# Patient Record
Sex: Male | Born: 2000 | Race: White | Hispanic: No | Marital: Single | Smoking: Never smoker
Health system: Southern US, Community
[De-identification: ages and names within clinical notes are randomized; demographics above are authoritative.]

## PROBLEM LIST (undated history)

## (undated) DIAGNOSIS — L309 Dermatitis, unspecified: Secondary | ICD-10-CM

## (undated) DIAGNOSIS — L709 Acne, unspecified: Secondary | ICD-10-CM

## (undated) DIAGNOSIS — T7840XA Allergy, unspecified, initial encounter: Secondary | ICD-10-CM

## (undated) HISTORY — PX: ADENOIDECTOMY: SUR15

## (undated) HISTORY — DX: Acne, unspecified: L70.9

## (undated) HISTORY — DX: Allergy, unspecified, initial encounter: T78.40XA

## (undated) HISTORY — PX: OTHER SURGICAL HISTORY: SHX169

## (undated) HISTORY — PX: TONSILLECTOMY: SUR1361

## (undated) HISTORY — DX: Dermatitis, unspecified: L30.9

---
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
## 2021-01-03 ENCOUNTER — Ambulatory Visit: Payer: No Typology Code available for payment source | Admitting: Dermatology

## 2021-01-07 ENCOUNTER — Ambulatory Visit (INDEPENDENT_AMBULATORY_CARE_PROVIDER_SITE_OTHER): Payer: No Typology Code available for payment source | Admitting: Dermatology

## 2021-01-07 ENCOUNTER — Other Ambulatory Visit: Payer: Self-pay

## 2021-01-07 ENCOUNTER — Encounter: Payer: Self-pay | Admitting: Dermatology

## 2021-01-07 DIAGNOSIS — L700 Acne vulgaris: Secondary | ICD-10-CM

## 2021-01-07 DIAGNOSIS — Z5181 Encounter for therapeutic drug level monitoring: Secondary | ICD-10-CM | POA: Diagnosis not present

## 2021-01-07 MED ORDER — ISOTRETINOIN 40 MG PO CAPS: 40.0000 mg | ORAL_CAPSULE | Freq: Every day | ORAL | 0 refills | Status: DC

## 2021-01-07 NOTE — Progress Notes (Signed)
[email or web address]

## 2021-01-15 ENCOUNTER — Ambulatory Visit: Payer: No Typology Code available for payment source | Admitting: Dermatology

## 2021-01-19 ENCOUNTER — Encounter: Payer: Self-pay | Admitting: Dermatology

## 2021-01-19 NOTE — Progress Notes (Signed)
   Follow-Up Visit   Subjective  Troy Harper is a 20 y.o. male who presents for the following: Acne (31 day isotretinoin f/u).  Acne Location: Mostly face Duration:  Quality: Clear Associated Signs/Symptoms: Modifying Factors: Isotretinoin Severity:  Timing: Context:   Objective  Well appearing patient in no apparent distress; mood and affect are within normal limits. Objective  Head - Anterior (Face): Acne essentially clear.  Moderate cheilitis, normal mood.  No epistaxis.    A focused examination was performed including Head and neck. Relevant physical exam findings are noted in the Assessment and Plan.   Assessment & Plan    Acne vulgaris Head - Anterior (Face)  Continue isotretinoin- follow up in 31 days.  If he remains completely clear, he will be just short of the [place] target dose in 1 month and he may choose to stop therapy at that point.  ISOtretinoin (ACCUTANE) 40 MG capsule - Head - Anterior (Face)  Encounter for therapeutic drug monitoring  Reordered Medications ISOtretinoin (ACCUTANE) 40 MG capsule      I, Janalyn Harder, MD, have reviewed all documentation for this visit.  The documentation on 01/19/21 for the exam, diagnosis, procedures, and orders are all accurate and complete.

## 2021-02-06 ENCOUNTER — Other Ambulatory Visit: Payer: Self-pay

## 2021-02-06 ENCOUNTER — Encounter: Payer: Self-pay | Admitting: Dermatology

## 2021-02-06 ENCOUNTER — Ambulatory Visit (INDEPENDENT_AMBULATORY_CARE_PROVIDER_SITE_OTHER): Payer: No Typology Code available for payment source | Admitting: Dermatology

## 2021-02-06 DIAGNOSIS — L700 Acne vulgaris: Secondary | ICD-10-CM | POA: Diagnosis not present

## 2021-02-06 DIAGNOSIS — Z5181 Encounter for therapeutic drug level monitoring: Secondary | ICD-10-CM | POA: Diagnosis not present

## 2021-02-06 MED ORDER — ISOTRETINOIN 40 MG PO CAPS: 40.0000 mg | ORAL_CAPSULE | Freq: Every day | ORAL | 0 refills | Status: DC

## 2021-02-07 ENCOUNTER — Telehealth: Payer: Self-pay | Admitting: *Deleted

## 2021-02-07 ENCOUNTER — Encounter: Payer: Self-pay | Admitting: Dermatology

## 2021-02-07 ENCOUNTER — Ambulatory Visit: Payer: No Typology Code available for payment source | Admitting: Dermatology

## 2021-02-07 NOTE — Telephone Encounter (Signed)
Received message in mychart regarding patient next appointment and if it was necessary. Called patient unable to leave a voicemail because mailbox was full.

## 2021-02-26 ENCOUNTER — Encounter: Payer: Self-pay | Admitting: Dermatology

## 2021-02-26 NOTE — Progress Notes (Signed)
   Follow-Up Visit   Subjective  Troy Harper is a 20 y.o. male who presents for the following: Follow-up (31 DAYS PATIENT STATED HE HAD A FEW NOSE BLEEDS).  Acne Location:  Duration:  Quality: Clear Associated Signs/Symptoms: Modifying Factors: Isotretinoin Severity:  Timing: Context:   Objective  Well appearing patient in no apparent distress; mood and affect are within normal limits. Objective  Head - Anterior (Face): Acne essentially completely clear    A focused examination was performed including Head and neck.. Relevant physical exam findings are noted in the Assessment and Plan.   Assessment & Plan    Acne vulgaris Head - Anterior (Face)  Last month isotretinoin, follow up by phone unless he has a significant flare in which case I will be pleased to see Maikel again.  ISOtretinoin (ACCUTANE) 40 MG capsule - Head - Anterior (Face)  Encounter for therapeutic drug monitoring  Reordered Medications ISOtretinoin (ACCUTANE) 40 MG capsule      I, Janalyn Harder, MD, have reviewed all documentation for this visit.  The documentation on 02/26/21 for the exam, diagnosis, procedures, and orders are all accurate and complete.

## 2021-03-07 ENCOUNTER — Ambulatory Visit: Payer: No Typology Code available for payment source | Admitting: Dermatology

## 2021-03-12 ENCOUNTER — Ambulatory Visit: Payer: No Typology Code available for payment source | Admitting: Dermatology

## 2021-03-25 NOTE — Telephone Encounter (Signed)
error 

## 2021-03-26 ENCOUNTER — Other Ambulatory Visit: Payer: Self-pay

## 2021-03-26 ENCOUNTER — Ambulatory Visit: Payer: No Typology Code available for payment source | Admitting: Dermatology

## 2021-03-26 ENCOUNTER — Encounter: Payer: Self-pay | Admitting: Dermatology

## 2021-03-26 ENCOUNTER — Ambulatory Visit (INDEPENDENT_AMBULATORY_CARE_PROVIDER_SITE_OTHER): Payer: No Typology Code available for payment source | Admitting: Dermatology

## 2021-03-26 ENCOUNTER — Ambulatory Visit: Payer: No Typology Code available for payment source | Admitting: Physician Assistant

## 2021-03-26 DIAGNOSIS — L700 Acne vulgaris: Secondary | ICD-10-CM | POA: Diagnosis not present

## 2021-04-05 ENCOUNTER — Encounter: Payer: Self-pay | Admitting: Dermatology

## 2021-04-05 NOTE — Progress Notes (Signed)
   Follow-Up Visit   Subjective  Troy Harper is a 20 y.o. male who presents for the following: Acne (31 days).  Acne Location:  Duration:  Quality: Clear Associated Signs/Symptoms: Modifying Factors: Isotretinoin Severity:  Timing: Context:   Objective  Well appearing patient in no apparent distress; mood and affect are within normal limits. Objective  Head - Anterior (Face): Inflammatory acne is completely clear and he will reach the 150 mg/kg target dose which is used by most of the oral with this prescription.  Avoid sunburn.    A focused examination was performed including Head and neck.. Relevant physical exam findings are noted in the Assessment and Plan.   Assessment & Plan    Acne vulgaris Head - Anterior (Face)  A final prescription of isotretinoin.  If there is a minor flare he will call me and we will add a topical retinoid.  If there is a severe flare I would be delighted to see Mr. Peatross back.  Other Related Medications ISOtretinoin (ACCUTANE) 40 MG capsule      I, Janalyn Harder, MD, have reviewed all documentation for this visit.  The documentation on 04/05/21 for the exam, diagnosis, procedures, and orders are all accurate and complete.

## 2021-07-01 ENCOUNTER — Encounter: Payer: Self-pay | Admitting: Dermatology

## 2021-07-01 NOTE — Progress Notes (Signed)
   Follow-Up Visit   Subjective  Troy Harper is a 20 y.o. male who presents for the following: Acne (isotret follow up).  Acne Location:  Duration:  Quality: Clear Associated Signs/Symptoms: Modifying Factors: Isotretinoin Severity:  Timing: Context:   Objective  Well appearing patient in no apparent distress; mood and affect are within normal limits. Head Inflammatory acne virtually clear.  Moderate cheilitis.  Normal mood.    A focused examination was performed including head and neck. Relevant physical exam findings are noted in the Assessment and Plan.   Assessment & Plan    Encounter for therapeutic drug monitoring  Related Medications ISOtretinoin (ACCUTANE) 40 MG capsule Take 1 capsule (40 mg total) by mouth daily.  Acne vulgaris Head  Maintain current isotretinoin dosing; will reach [place] target dose in 3 months.  Related Medications clindamycin (CLINDAGEL) 1 % gel Apply topically in the morning.  ISOtretinoin (ACCUTANE) 40 MG capsule Take 1 capsule (40 mg total) by mouth daily.      I, Janalyn Harder, MD, have reviewed all documentation for this visit.  The documentation on 07/01/21 for the exam, diagnosis, procedures, and orders are all accurate and complete.

## 2022-05-02 ENCOUNTER — Ambulatory Visit: Payer: No Typology Code available for payment source | Admitting: Nurse Practitioner

## 2022-05-06 ENCOUNTER — Ambulatory Visit: Payer: No Typology Code available for payment source | Admitting: Nurse Practitioner

## 2022-05-14 ENCOUNTER — Encounter: Payer: Self-pay | Admitting: Nurse Practitioner

## 2022-05-14 ENCOUNTER — Ambulatory Visit (INDEPENDENT_AMBULATORY_CARE_PROVIDER_SITE_OTHER): Payer: No Typology Code available for payment source | Admitting: Nurse Practitioner

## 2022-05-14 ENCOUNTER — Other Ambulatory Visit: Payer: Self-pay

## 2022-05-14 VITALS — BP 133/81 | HR 63 | Temp 98.3°F | Resp 20 | Ht 62.0 in | Wt 139.0 lb

## 2022-05-14 DIAGNOSIS — B070 Plantar wart: Secondary | ICD-10-CM | POA: Diagnosis not present

## 2022-05-14 MED ORDER — CLINDAMYCIN PHOSPHATE 1 % EX GEL: Freq: Two times a day (BID) | CUTANEOUS | 2 refills | Status: DC

## 2022-05-14 NOTE — Patient Instructions (Signed)
Cryoablation Cryoablation is a procedure used to remove abnormal growths or cancerous tissue. This is done by freezing the growth or tissue with either liquid nitrogen or argon gas. This procedure is also known as cryotherapy or cryosurgery. This procedure may be done to treat many conditions, including: Skin tumors. Non-cancerous (benign) knots of tissue called nodules. A type of eye cancer (retinoblastoma). Cancers of the prostate, liver, kidney, cervix, lung, and bone. Tell a health care provider about: Any allergies you have. All medicines you are taking, including vitamins, herbs, eye drops, creams, and over-the-counter medicines. Any problems you or family members have had with anesthetic medicines. Any blood disorders you have. Any surgeries you have had. Any medical conditions you have. Whether you are pregnant or may be pregnant. What are the risks? Generally, this is a safe procedure. However, problems may occur, including: Infection. Bleeding. Swelling. Allergic reactions to medicines. Damage to nearby structures or organs, such as damage to nerves, which may cause numbness. This is rare. What happens before the procedure? Staying hydrated Follow instructions from your health care provider about hydration, which may include: Up to 2 hours before the procedure - you may continue to drink clear liquids, such as water, clear fruit juice, black coffee, and plain tea.  Eating and drinking restrictions Follow instructions from your health care provider about eating and drinking, which may include: 8 hours before the procedure - stop eating heavy meals or foods, such as meat, fried foods, or fatty foods. 6 hours before the procedure - stop eating light meals or foods, such as toast or cereal. 6 hours before the procedure - stop drinking milk or drinks that contain milk. 2 hours before the procedure - stop drinking clear liquids. Medicines Ask your health care provider  about: Changing or stopping your regular medicines. This is especially important if you are taking diabetes medicines or blood thinners. Taking medicines such as aspirin and ibuprofen. These medicines can thin your blood. Do not take these medicines unless your health care provider tells you to take them. Taking over-the-counter medicines, vitamins, herbs, and supplements. Tests You may have tests done, including blood tests and imaging tests. General instructions A medical history will be taken, and a physical exam will be done. Do not use any products that contain nicotine or tobacco for at least 4 weeks before the procedure. These products include cigarettes, e-cigarettes, and chewing tobacco. If you need help quitting, ask your health care provider. Ask your health care provider: How your surgery site will be marked. What steps will be taken to help prevent infection. These may include: Removing hair at the surgery site. Washing skin with a germ-killing soap. Receiving antibiotic medicine. Plan to have someone take you home from the hospital or clinic. If you will be going home right after the procedure, plan to have someone with you for 24 hours. What happens during the procedure? An IV will be inserted into one of your veins. You will be given one or more of the following: A medicine to help you relax (sedative). A medicine to numb the area (local anesthetic). A medicine to make you fall asleep (general anesthetic). A medicine that is injected into your spine to numb the area below and slightly above the injection site (spinal anesthetic). A medicine that is injected into an area of your body to numb everything below the injection site (regional anesthetic). A device called a cryoprobe will be used to treat the growth by freezing it. The cryoprobe has liquid   nitrogen or argon gas flowing through it. Depending on how deep in the body the growth is found, the cryoprobe can be: Applied  directly to the area, such as for skin cancers or nodules. Inserted through an incision into the area, such as the prostate. Passed through a thin, long tube (endoscope) to reach deeper areas of the body, such as the lungs or liver. The cryoprobe may be guided using imaging, such as an ultrasound, a CT scan, or an MRI. Liquid nitrogen or argon gas will be delivered through the cryoprobe to the growth until it is frozen and destroyed. The process may be repeated on other areas depending on how many areas need treatment. The cryoprobe will be removed, and pressure will be applied to stop any bleeding. If an incision was made, it will be closed with stitches (sutures) and covered with a bandage (dressing). The procedure will vary depending on the location of the growth. The procedure may also vary among health care providers and hospitals. What happens after the procedure?  Your blood pressure, heart rate, breathing rate, and blood oxygen level will be monitored until you leave the hospital or clinic. You will be given medicine to help with pain, nausea, and vomiting as needed. If you were given a sedative during the procedure, it can affect you for several hours. Do not drive or operate machinery until your health care provider says that it is safe. Summary Cryoablation is a procedure used to remove abnormal growths or cancerous tissue. This is done by freezing the growth or tissue with either liquid nitrogen or argon gas. If you will be going home right after the procedure, plan to have someone with you for 24 hours. Your health care provider may use a device called a cryoprobe that has liquid nitrogen or argon gas flowing through it. Liquid nitrogen or argon gas will be delivered through the cryoprobe to the growth until it is frozen and destroyed. The process may be repeated on other areas depending on how many areas need treatment. This information is not intended to replace advice given to you  by your health care provider. Make sure you discuss any questions you have with your health care provider. Document Revised: 08/25/2019 Document Reviewed: 08/25/2019 Elsevier Patient Education  2023 [employer].

## 2022-05-14 NOTE — Progress Notes (Signed)
   Subjective:    Patient ID: Troy Harper, male    DOB: Dec 05, 2000, 21 y.o.   MRN: [id number]   Chief Complaint: plantar wart  HPI Wart in bottom of left foot. Painful to touch. Has been using OTC treatments and has helped some.  Needs clindamycin gel for his acne refilled  Review of Systems  Constitutional:  Negative for diaphoresis.  Eyes:  Negative for pain.  Respiratory:  Negative for shortness of breath.   Cardiovascular:  Negative for chest pain, palpitations and leg swelling.  Gastrointestinal:  Negative for abdominal pain.  Endocrine: Negative for polydipsia.  Skin:  Negative for rash.  Neurological:  Negative for dizziness, weakness and headaches.  Hematological:  Does not bruise/bleed easily.  All other systems reviewed and are negative.      Objective:   Physical Exam Vitals reviewed.  Constitutional:      Appearance: Normal appearance.  Cardiovascular:     Rate and Rhythm: Normal rate and regular rhythm.     Heart sounds: Normal heart sounds.  Pulmonary:     Effort: Pulmonary effort is normal.     Breath sounds: Normal breath sounds.  Skin:    General: Skin is warm.  Neurological:     General: No focal deficit present.     Mental Status: He is alert and oriented to person, place, and time.  Psychiatric:        Mood and Affect: Mood normal.        Behavior: Behavior normal.    BP 133/81   Pulse 63   Temp 98.3 F (36.8 C) (Temporal)   Resp 20   Ht 5\' 2"  (1.575 m)   Wt 139 lb (63 kg)   SpO2 100%   BMI 25.42 kg/m    Cryotherapy of wart on left foot     Assessment & Plan:  in today with chief complaint of Area to bottom of left foot   1. Plantar wart Keep clean and dry Do not pick or scratch Soak in epsom salt bid Watch for signs of infection RTO prn    The above assessment and management plan was discussed with the patient. The patient verbalized understanding of and has agreed to the management plan. Patient is aware  to call the clinic if symptoms persist or worsen. Patient is aware when to return to the clinic for a follow-up visit. Patient educated on when it is appropriate to go to the emergency department.   Troy Troy Crape, FNP

## 2022-06-24 ENCOUNTER — Ambulatory Visit: Payer: No Typology Code available for payment source | Admitting: Dermatology

## 2022-09-09 ENCOUNTER — Ambulatory Visit
Payer: No Typology Code available for payment source | Attending: Cardiovascular Disease | Admitting: Interventional Cardiology

## 2022-09-09 VITALS — BP 128/54 | HR 63 | Ht 64.5 in | Wt 141.0 lb

## 2022-09-09 DIAGNOSIS — R002 Palpitations: Secondary | ICD-10-CM | POA: Diagnosis not present

## 2022-09-09 NOTE — Progress Notes (Signed)
Cardiology Office Note   Date:  09/09/2022   ID:  Troy Harper, DOB [birth year], MRN [id number]  PCP:  Bennie Pierini, FNP    No chief complaint on file.  palpitations  Wt Readings from Last 3 Encounters:  09/09/22 141 lb (64 kg)  05/14/22 139 lb (63 kg)  08/14/20 138 lb (62.6 kg) (30 %, Z= -0.52)*   * Growth percentiles are based on CDC (Boys, 2-20 Years) data.       History of Present Illness: Troy Harper is a 21 y.o. male who is being seen today for the evaluation of palpitations at the request of Rebekah Chesterfield, NP.  Primary care records show that he reports feeling like his heart is beating harder than normal.  Episodes occur 2-3 times a day.  There is occasional shortness of breath.  Typically episodes occur while he is sitting down.  Sx were more pronounced in the summer.  No sx in the past month.   He is a Consulting civil engineer at [employer].  He runs on the treadmill and does weights.   Of late, he Denies : Chest pain. Dizziness. Leg edema. Nitroglycerin use. Orthopnea. Palpitations. Paroxysmal nocturnal dyspnea. Shortness of breath. Syncope.    Past Medical History:  Diagnosis Date   Acne    Allergy     Past Surgical History:  Procedure Laterality Date   ADENOIDECTOMY     TONSILLECTOMY     TUBES IN EARS       Current Outpatient Medications  Medication Sig Dispense Refill   cetirizine (ZYRTEC) 10 MG tablet Take 10 mg by mouth daily.     clindamycin (CLINDAGEL) 1 % gel Apply topically 2 (two) times daily. 30 g 2   triamcinolone (NASACORT) 55 MCG/ACT AERO nasal inhaler      triamcinolone cream (KENALOG) 0.1 % Apply 1 application topically 2 (two) times daily. 135 g 3   SODIUM FLUORIDE 5000 PPM 1.1 % PSTE Take by mouth as directed.     No current facility-administered medications for this visit.    Allergies:   Menactra [mening acy&w-135 diphth conj]    Social History:  The patient  reports that he has never smoked. He has never used smokeless tobacco.  He reports that he does not drink alcohol and does not use drugs.   Family History:  The patient's family history includes Cancer in his paternal grandfather; Hypertension in his maternal grandfather, maternal grandmother, mother, paternal grandfather, and paternal grandmother; Multiple sclerosis in his father.    ROS:  Please see the history of present illness.   Otherwise, review of systems are positive for palpitations-improved.   All other systems are reviewed and negative.    PHYSICAL EXAM: VS:  BP (!) 128/54   Pulse 63   Ht 5' 4.5" (1.638 m)   Wt 141 lb (64 kg)   BMI 23.83 kg/m  , BMI Body mass index is 23.83 kg/m. GEN: Well nourished, well developed, in no acute distress HEENT: normal Neck: no JVD, carotid bruits, or masses Cardiac: RRR; no murmurs, rubs, or gallops,no edema  Respiratory:  clear to auscultation bilaterally, normal work of breathing GI: soft, nontender, nondistended, + BS MS: no deformity or atrophy Skin: warm and dry, no rash Neuro:  Strength and sensation are intact Psych: euthymic mood, full affect   EKG:   The ekg ordered today demonstrates NSR, nonspecific ST changes   Recent Labs: No results found for requested labs within last 365 days.   Lipid Panel  Component Value Date/Time   CHOL 174 (H) 08/14/2020 1449   TRIG 86 08/14/2020 1449   HDL 47 08/14/2020 1449   CHOLHDL 3.7 08/14/2020 1449   LDLCALC 109 08/14/2020 1449     Other studies Reviewed: Additional studies/ records that were reviewed today with results demonstrating: Event monitor reviewed.   ASSESSMENT AND PLAN:  Palpitations: Resolved.  No high risk features when he had them.  He did have a PAC noted on his monitor.  Symptom pattern is consistent with symptomatic PACs and that he felt the palpitations more at rest.  Avoid excessive caffeine.  He only drinks milk and water.  We talked about beta-blockade but I would not recommend that he take any medication at his young age for  very mild symptoms. We discussed further testing.  Since his symptoms have resolved and he has a normal exam as well as essentially normal ECG, no further testing ordered.  If he had any change in his exercise tolerance or fluid retention, will plan for echocardiogram. Continue regular exercise.  No restrictions.  He will let us know if there is any change in his stamina.  Patient and his mom are present and agree with the plan.   Current medicines are reviewed at length with the patient today.  The patient concerns regarding his medicines were addressed.  The following changes have been made:  No change  Labs/ tests ordered today include:  No orders of the defined types were placed in this encounter.   Recommend 150 minutes/week of aerobic exercise Low fat, low carb, high fiber diet recommended  Disposition:   FU as needed   Signed, Larae Grooms, MD  09/09/2022 3:32 PM    [facility] [place], [place], [place]  [address] Phone: [phone]; Fax: [phone]

## 2022-09-09 NOTE — Patient Instructions (Signed)
Medication Instructions:  Your physician recommends that you continue on your current medications as directed. Please refer to the Current Medication list given to you today.  *If you need a refill on your cardiac medications before your next appointment, please call your pharmacy*   Lab Work: none If you have labs (blood work) drawn today and your tests are completely normal, you will receive your results only by: MyChart Message (if you have MyChart) OR A paper copy in the mail If you have any lab test that is abnormal or we need to change your treatment, we will call you to review the results.   Testing/Procedures: none   Follow-Up: At [facility], you and your health needs are our priority.  As part of our continuing mission to provide you with exceptional heart care, we have created designated Provider Care Teams.  These Care Teams include your primary Cardiologist (physician) and Advanced Practice Providers (APPs -  Physician Assistants and Nurse Practitioners) who all work together to provide you with the care you need, when you need it.  We recommend signing up for the patient portal called "MyChart".  Sign up information is provided on this After Visit Summary.  MyChart is used to connect with patients for Virtual Visits (Telemedicine).  Patients are able to view lab/test results, encounter notes, upcoming appointments, etc.  Non-urgent messages can be sent to your provider as well.   To learn more about what you can do with MyChart, go to [email or web address].com.    Your next appointment:   As needed  The format for your next appointment:   In Person  Provider:   Jayadeep Varanasi, MD     Other Instructions   Important Information About Sugar       

## 2022-09-12 ENCOUNTER — Ambulatory Visit: Payer: No Typology Code available for payment source | Admitting: Interventional Cardiology

## 2022-09-12 ENCOUNTER — Ambulatory Visit: Payer: No Typology Code available for payment source | Admitting: Cardiovascular Disease

## 2022-10-20 ENCOUNTER — Other Ambulatory Visit: Payer: Self-pay | Admitting: Nurse Practitioner

## 2022-11-05 ENCOUNTER — Encounter: Payer: Self-pay | Admitting: Interventional Cardiology

## 2023-03-02 ENCOUNTER — Other Ambulatory Visit: Payer: Self-pay | Admitting: Nurse Practitioner

## 2023-03-02 NOTE — Telephone Encounter (Signed)
Last office visit 05/14/22 Last refill 10/20/22, 30 grams, 1 refill

## 2023-09-13 ENCOUNTER — Other Ambulatory Visit: Payer: Self-pay

## 2023-09-13 ENCOUNTER — Emergency Department (HOSPITAL_BASED_OUTPATIENT_CLINIC_OR_DEPARTMENT_OTHER)
Admission: EM | Admit: 2023-09-13 | Discharge: 2023-09-13 | Disposition: A | Discharge status: Home / Self Care | Payer: No Typology Code available for payment source | Attending: Emergency Medicine | Admitting: Emergency Medicine

## 2023-09-13 ENCOUNTER — Encounter (HOSPITAL_BASED_OUTPATIENT_CLINIC_OR_DEPARTMENT_OTHER): Payer: Self-pay | Admitting: Emergency Medicine

## 2023-09-13 ENCOUNTER — Ambulatory Visit (HOSPITAL_COMMUNITY): Payer: No Typology Code available for payment source

## 2023-09-13 DIAGNOSIS — R1110 Vomiting, unspecified: Secondary | ICD-10-CM | POA: Diagnosis present

## 2023-09-13 DIAGNOSIS — Z1152 Encounter for screening for COVID-19: Secondary | ICD-10-CM | POA: Diagnosis not present

## 2023-09-13 LAB — COMPREHENSIVE METABOLIC PANEL
ALT: 37 U/L (ref 0–44)
AST: 22 U/L (ref 15–41)
Albumin: 4.4 g/dL (ref 3.5–5.0)
Alkaline Phosphatase: 49 U/L (ref 38–126)
Anion gap: 6 (ref 5–15)
BUN: 12 mg/dL (ref 6–20)
CO2: 27 mmol/L (ref 22–32)
Calcium: 9 mg/dL (ref 8.9–10.3)
Chloride: 105 mmol/L (ref 98–111)
Creatinine, Ser: 0.84 mg/dL (ref 0.61–1.24)
GFR, Estimated: >60 mL/min (ref >60)
Glucose, Bld: 88 mg/dL (ref 70–99)
Potassium: 3.3 mmol/L — ABNORMAL LOW (ref 3.5–5.1)
Sodium: 138 mmol/L (ref 135–145)
Total Bilirubin: 0.8 mg/dL (ref 0.3–1.2)
Total Protein: 7.1 g/dL (ref 6.5–8.1)

## 2023-09-13 LAB — CBC WITH DIFFERENTIAL/PLATELET
Abs Immature Granulocytes: 0.01 10*3/uL (ref 0.00–0.07)
Basophils Absolute: 0 10*3/uL (ref 0.0–0.1)
Basophils Relative: 0 %
Eosinophils Absolute: 0.1 10*3/uL (ref 0.0–0.5)
Eosinophils Relative: 2 %
HCT: 39.1 % (ref 39.0–52.0)
Hemoglobin: 13.8 g/dL (ref 13.0–17.0)
Immature Granulocytes: 0 %
Lymphocytes Relative: 35 %
Lymphs Abs: 1.9 10*3/uL (ref 0.7–4.0)
MCH: 30.5 pg (ref 26.0–34.0)
MCHC: 35.3 g/dL (ref 30.0–36.0)
MCV: 86.5 fL (ref 80.0–100.0)
Monocytes Absolute: 0.3 10*3/uL (ref 0.1–1.0)
Monocytes Relative: 6 %
Neutro Abs: 3 10*3/uL (ref 1.7–7.7)
Neutrophils Relative %: 57 %
Platelets: 209 10*3/uL (ref 150–400)
RBC: 4.52 MIL/uL (ref 4.22–5.81)
RDW: 12.2 % (ref 11.5–15.5)
WBC: 5.4 10*3/uL (ref 4.0–10.5)
nRBC: 0 % (ref 0.0–0.2)

## 2023-09-13 LAB — URINALYSIS, ROUTINE W REFLEX MICROSCOPIC
Bilirubin Urine: NEGATIVE
Glucose, UA: NEGATIVE mg/dL
Hgb urine dipstick: NEGATIVE
Ketones, ur: NEGATIVE mg/dL
Leukocytes,Ua: NEGATIVE
Nitrite: NEGATIVE
Protein, ur: NEGATIVE mg/dL
Specific Gravity, Urine: 1.01 (ref 1.005–1.030)
pH: 6 (ref 5.0–8.0)

## 2023-09-13 LAB — RESP PANEL BY RT-PCR (RSV, FLU A&B, COVID)  RVPGX2
Influenza A by PCR: NEGATIVE
Influenza B by PCR: NEGATIVE
Resp Syncytial Virus by PCR: NEGATIVE
SARS Coronavirus 2 by RT PCR: NEGATIVE

## 2023-09-13 LAB — MONONUCLEOSIS SCREEN: Mono Screen: NEGATIVE

## 2023-09-13 LAB — LIPASE, BLOOD: Lipase: 31 U/L (ref 11–51)

## 2023-09-13 MED ORDER — ONDANSETRON HCL 4 MG PO TABS: 4.0000 mg | ORAL_TABLET | Freq: Three times a day (TID) | ORAL | 0 refills | Status: DC | PRN

## 2023-09-13 NOTE — ED Provider Notes (Signed)
[facility] Provider Note  CSN: [id number] Arrival date & time: 09/13/23 1442  Chief Complaint(s) Emesis  HPI Troy Harper is a 22 y.o. male with no significant past medical history presenting to the emergency department with vomiting.  Patient reports vomiting for around 2 weeks.  It occurs almost always after eating.  He reports that he eats, feels full and then feels like vomiting and vomits.  No abdominal pain.  No fevers or chills.  No headaches or head injury.  No chest pain or shortness of breath.  No leg swelling.  Recently also has been dealing with a lingering cough for 4 to 5 weeks and was on multiple courses of antibiotics, prednisone, Mucinex.  Currently continue to take Mucinex and Tessalon Perles although his cough is significantly improved.   Past Medical History Past Medical History:  Diagnosis Date   Acne    Allergy    Patient Active Problem List   Diagnosis Date Noted   Pain of left hip joint 09/11/2017   Bursitis of left hip 09/11/2017   Family history of Crohn's disease 04/08/2017   Home Medication(s) Prior to Admission medications   Medication Sig Start Date End Date Taking? Authorizing Provider  ondansetron (ZOFRAN) 4 MG tablet Take 1 tablet (4 mg total) by mouth every 8 (eight) hours as needed for nausea or vomiting. 09/13/23  Yes Lonell Grandchild, MD  cetirizine (ZYRTEC) 10 MG tablet Take 10 mg by mouth daily.    [provider]  clindamycin (CLINDAGEL) 1 % gel Apply topically 2 (two) times daily. 03/02/23   Daphine Deutscher, Mary-Margaret, FNP  SODIUM FLUORIDE 5000 PPM 1.1 % PSTE Take by mouth as directed. 07/28/22   [provider]  triamcinolone (NASACORT) 55 MCG/ACT AERO nasal inhaler     [provider]  triamcinolone cream (KENALOG) 0.1 % Apply 1 application topically 2 (two) times daily. 07/06/19   Remus Loffler, PA-C                                                                                                                                     Past Surgical History Past Surgical History:  Procedure Laterality Date   ADENOIDECTOMY     TONSILLECTOMY     TUBES IN EARS     Family History Family History  Problem Relation Age of Onset   Hypertension Mother    Multiple sclerosis Father    Hypertension Maternal Grandmother    Hypertension Maternal Grandfather    Hypertension Paternal Grandmother    Hypertension Paternal Grandfather    Cancer Paternal Grandfather     Social History Social History   Tobacco Use   Smoking status: Never   Smokeless tobacco: Never  Vaping Use   Vaping status: Never Used  Substance Use Topics   Alcohol use: No   Drug use: No   Allergies Menactra [mening acy&w-135 diphth conj]  Review of Systems Review of Systems  All other systems reviewed and are negative.   Physical Exam Vital Signs  I have reviewed the triage vital signs BP (!) 131/93 (BP Location: Right Arm)   Pulse 70   Temp (!) 97.5 F (36.4 C) (Oral)   Resp 16   Ht 5\' 5"  (1.651 m)   Wt 59 kg   SpO2 100%   BMI 21.63 kg/m  Physical Exam Vitals and nursing note reviewed.  Constitutional:      General: He is not in acute distress.    Appearance: Normal appearance.  HENT:     Mouth/Throat:     Mouth: Mucous membranes are moist.  Eyes:     Conjunctiva/sclera: Conjunctivae normal.  Cardiovascular:     Rate and Rhythm: Normal rate and regular rhythm.  Pulmonary:     Effort: Pulmonary effort is normal. No respiratory distress.     Breath sounds: Normal breath sounds.  Abdominal:     General: Abdomen is flat.     Palpations: Abdomen is soft.     Tenderness: There is no abdominal tenderness.  Musculoskeletal:     Right lower leg: No edema.     Left lower leg: No edema.  Skin:    General: Skin is warm and dry.     Capillary Refill: Capillary refill takes less than 2 seconds.  Neurological:     Mental Status: He is alert and oriented to person, place, and  time. Mental status is at baseline.  Psychiatric:        Mood and Affect: Mood normal.        Behavior: Behavior normal.     ED Results and Treatments Labs (all labs ordered are listed, but only abnormal results are displayed) Labs Reviewed  COMPREHENSIVE METABOLIC PANEL - Abnormal; Notable for the following components:      Result Value   Potassium 3.3 (*)    All other components within normal limits  RESP PANEL BY RT-PCR (RSV, FLU A&B, COVID)  RVPGX2  CBC WITH DIFFERENTIAL/PLATELET  URINALYSIS, ROUTINE W REFLEX MICROSCOPIC  MONONUCLEOSIS SCREEN  LIPASE, BLOOD                                                                                                                          Radiology No results found.  Pertinent labs & imaging results that were available during my care of the patient were reviewed by me and considered in my medical decision making (see MDM for details).  Medications Ordered in ED Medications - No data to display  Procedures Procedures  (including critical care time)  Medical Decision Making / ED Course   MDM:  22 year old presenting with cough.  Patient overall very well-appearing, physical exam with no abdominal tenderness.  Patient does not appear dehydrated.  Unclear cause of vomiting.  Considered intra-abdominal process such as appendicitis, pancreatitis, obstruction, perforation, abscess, but lower concern for these given length of symptoms, reassuring abdominal exam.  Doubt any intracranial process without head injury or severe headaches.  Could be medication related as patient has been on a lot of different medications recently for cough.  given the patient is otherwise healthy, laboratory workup is reassuring, and his examination is unremarkable, extreme low concern for any life-threatening cause of vomiting at this  time, but recommended close follow-up with primary care physician.  Also recommended popping some of these medications he has been taking for cough since his cough is improved, to see if his vomiting is also improved.      Additional history obtained: -Additional history obtained from family   Lab Tests: -I ordered, reviewed, and interpreted labs.   The pertinent results include:   Labs Reviewed  COMPREHENSIVE METABOLIC PANEL - Abnormal; Notable for the following components:      Result Value   Potassium 3.3 (*)    All other components within normal limits  RESP PANEL BY RT-PCR (RSV, FLU A&B, COVID)  RVPGX2  CBC WITH DIFFERENTIAL/PLATELET  URINALYSIS, ROUTINE W REFLEX MICROSCOPIC  MONONUCLEOSIS SCREEN  LIPASE, BLOOD    Notable for mild hypokalemia   EKG   EKG Interpretation Date/Time:    Ventricular Rate:    PR Interval:    QRS Duration:    QT Interval:    QTC Calculation:   R Axis:      Text Interpretation:            Medicines ordered and prescription drug management: Meds ordered this encounter  Medications   ondansetron (ZOFRAN) 4 MG tablet    Sig: Take 1 tablet (4 mg total) by mouth every 8 (eight) hours as needed for nausea or vomiting.    Dispense:  20 tablet    Refill:  0    -I have reviewed the patients home medicines and have made adjustments as needed  Co morbidities that complicate the patient evaluation  Past Medical History:  Diagnosis Date   Acne    Allergy       Dispostion: Disposition decision including need for hospitalization was considered, and patient discharged from emergency department.    Final Clinical Impression(s) / ED Diagnoses Final diagnoses:  Vomiting, unspecified vomiting type, unspecified whether nausea present     This chart was dictated using voice recognition software.  Despite best efforts to proofread,  errors can occur which can change the documentation meaning.    Lonell Grandchild, MD 09/13/23  1800

## 2023-09-13 NOTE — ED Triage Notes (Signed)
Pt reports he had a cough x 4-5 wks; this cleared up a couple weeks ago (was seen 3x  and was on multiple medications, which he has complete); now he c/o vomiting for about 2 wks after eating; has lost about 15 lb since the cough started

## 2023-09-13 NOTE — Discharge Instructions (Addendum)
We evaluated you for your vomiting.  Your laboratory testing was reassuring.  Your physical examination was also reassuring.  I am not sure why you are having the vomiting but I do not think it is caused by a dangerous or life-threatening process.  I prescribed you a nausea medication called Zofran which you can take up to every 8 hours as needed to help prevent further vomiting.  Try to eat smaller meals to see if this helps.  Since your cough is improving, I would try to stop some of the cough medications you have been taking to see if this helps with your vomiting.  If it does not seem to help it should be fine to continue to take them as needed for cough.  Please follow-up closely with your primary doctor.  If needed, you can also schedule follow-up with a gastroenterologist.  If you develop any new or worsening symptoms such as abdominal pain, lightheadedness or dizziness, fevers or chills, bloody vomit, bloody diarrhea, weakness, severe headaches, or any other new symptoms, please return to the emergency department.

## 2024-02-26 ENCOUNTER — Other Ambulatory Visit: Payer: Self-pay | Admitting: Nurse Practitioner

## 2024-03-02 NOTE — Progress Notes (Unsigned)
 New Patient Note  RE: Troy Harper MRN: [id number] DOB: [birth year] Date of Office Visit: 03/03/2024  Consult requested by: Rebekah Chesterfield, NP Primary care provider: Rebekah Chesterfield, NP  Chief Complaint: No chief complaint on file.  History of Present Illness: I had the pleasure of seeing Troy Harper for initial evaluation at the [facility] on 03/02/2024. He is a 23 y.o. male, who is referred here by Rebekah Chesterfield, NP for the evaluation of ***.  Discussed the use of AI scribe software for clinical note transcription with the patient, who gave verbal consent to proceed.  History of Present Illness             ***  Assessment and Plan: Azir is a 23 y.o. male with: ***  Assessment and Plan               No follow-ups on file.  No orders of the defined types were placed in this encounter.  Lab Orders  No laboratory test(s) ordered today    Other allergy screening: Asthma: {Blank single:19197::"yes","no"} Rhino conjunctivitis: {Blank single:19197::"yes","no"} Food allergy: {Blank single:19197::"yes","no"} Medication allergy: {Blank single:19197::"yes","no"} Hymenoptera allergy: {Blank single:19197::"yes","no"} Urticaria: {Blank single:19197::"yes","no"} Eczema:{Blank single:19197::"yes","no"} History of recurrent infections suggestive of immunodeficency: {Blank single:19197::"yes","no"}  Diagnostics: Spirometry:  Tracings reviewed. His effort: {Blank single:19197::"Good reproducible efforts.","It was hard to get consistent efforts and there is a question as to whether this reflects a maximal maneuver.","Poor effort, data can not be interpreted."} FVC: ***L FEV1: ***L, ***% predicted FEV1/FVC ratio: ***% Interpretation: {Blank single:19197::"Spirometry consistent with mild obstructive disease","Spirometry consistent with moderate obstructive disease","Spirometry consistent with severe obstructive disease","Spirometry  consistent with possible restrictive disease","Spirometry consistent with mixed obstructive and restrictive disease","Spirometry uninterpretable due to technique","Spirometry consistent with normal pattern","No overt abnormalities noted given today's efforts"}.  Please see scanned spirometry results for details.  Skin Testing: {Blank single:19197::"Select foods","Environmental allergy panel","Environmental allergy panel and select foods","Food allergy panel","None","Deferred due to recent antihistamines use"}. *** Results discussed with patient/family.   Past Medical History: Patient Active Problem List   Diagnosis Date Noted  . Pain of left hip joint 09/11/2017  . Bursitis of left hip 09/11/2017  . Family history of Crohn's disease 04/08/2017   Past Medical History:  Diagnosis Date  . Acne   . Allergy    Past Surgical History: Past Surgical History:  Procedure Laterality Date  . ADENOIDECTOMY    . TONSILLECTOMY    . TUBES IN EARS     Medication List:  Current Outpatient Medications  Medication Sig Dispense Refill  . cetirizine (ZYRTEC) 10 MG tablet Take 10 mg by mouth daily.    . clindamycin (CLINDAGEL) 1 % gel Apply topically 2 (two) times daily. 30 g 1  . ondansetron (ZOFRAN) 4 MG tablet Take 1 tablet (4 mg total) by mouth every 8 (eight) hours as needed for nausea or vomiting. 20 tablet 0  . SODIUM FLUORIDE 5000 PPM 1.1 % PSTE Take by mouth as directed.    . triamcinolone (NASACORT) 55 MCG/ACT AERO nasal inhaler     . triamcinolone cream (KENALOG) 0.1 % Apply 1 application topically 2 (two) times daily. 135 g 3   No current facility-administered medications for this visit.   Allergies: Allergies  Allergen Reactions  . Menactra [Mening Acy&W-135 Diphth Conj] Rash   Social History: Social History   Socioeconomic History  . Marital status: Single    Spouse name: Not on file  . Number of children: Not on file  .  Years of education: Not on file  . Highest education  level: Not on file  Occupational History  . Not on file  Tobacco Use  . Smoking status: Never  . Smokeless tobacco: Never  Vaping Use  . Vaping status: Never Used  Substance and Sexual Activity  . Alcohol use: No  . Drug use: No  . Sexual activity: Never  Other Topics Concern  . Not on file  Social History Narrative  . Not on file   Social Drivers of Health   Financial Resource Strain: Not on file  Food Insecurity: Not on file  Transportation Needs: Not on file  Physical Activity: Not on file  Stress: Not on file  Social Connections: Unknown (04/11/2022)   Received from [facility]   Social Network   . Social Network: Not on file   Lives in a ***. Smoking: *** Occupation: ***  Environmental HistorySurveyor, minerals in the house: Copywriter, advertising in the family room: {Blank single:19197::"yes","no"} Carpet in the bedroom: {Blank single:19197::"yes","no"} Heating: {Blank single:19197::"electric","gas","heat pump"} Cooling: {Blank single:19197::"central","window","heat pump"} Pet: {Blank single:19197::"yes ***","no"}  Family History: Family History  Problem Relation Age of Onset  . Hypertension Mother   . Multiple sclerosis Father   . Hypertension Maternal Grandmother   . Hypertension Maternal Grandfather   . Hypertension Paternal Grandmother   . Hypertension Paternal Grandfather   . Cancer Paternal Grandfather    Problem                               Relation Asthma                                   *** Eczema                                *** Food allergy                          *** Allergic rhino conjunctivitis     ***  Review of Systems  Constitutional:  Negative for appetite change, chills, fever and unexpected weight change.  HENT:  Negative for congestion and rhinorrhea.   Eyes:  Negative for itching.  Respiratory:  Negative for cough, chest tightness, shortness of breath and wheezing.   Cardiovascular:   Negative for chest pain.  Gastrointestinal:  Negative for abdominal pain.  Genitourinary:  Negative for difficulty urinating.  Skin:  Negative for rash.  Neurological:  Negative for headaches.   Objective: There were no vitals taken for this visit. There is no height or weight on file to calculate BMI. Physical Exam Vitals and nursing note reviewed.  Constitutional:      Appearance: Normal appearance. He is well-developed.  HENT:     Head: Normocephalic and atraumatic.     Right Ear: Tympanic membrane and external ear normal.     Left Ear: Tympanic membrane and external ear normal.     Nose: Nose normal.     Mouth/Throat:     Mouth: Mucous membranes are moist.     Pharynx: Oropharynx is clear.  Eyes:     Conjunctiva/sclera: Conjunctivae normal.  Cardiovascular:     Rate and Rhythm: Normal rate and regular rhythm.     Heart sounds: Normal heart sounds. No murmur  heard.    No friction rub. No gallop.  Pulmonary:     Effort: Pulmonary effort is normal.     Breath sounds: Normal breath sounds. No wheezing, rhonchi or rales.  Musculoskeletal:     Cervical back: Neck supple.  Skin:    General: Skin is warm.     Findings: No rash.  Neurological:     Mental Status: He is alert and oriented to person, place, and time.  Psychiatric:        Behavior: Behavior normal.  The plan was reviewed with the patient/family, and all questions/concerned were addressed.  It was my pleasure to see Troy Harper today and participate in his care. Please feel free to contact me with any questions or concerns.  Sincerely,  Wyline Mood, DO Allergy & Immunology  [facility] office: [phone] [facility] office: [phone]

## 2024-03-03 ENCOUNTER — Encounter: Payer: Self-pay | Admitting: Allergy

## 2024-03-03 ENCOUNTER — Ambulatory Visit (INDEPENDENT_AMBULATORY_CARE_PROVIDER_SITE_OTHER): Admitting: Allergy

## 2024-03-03 ENCOUNTER — Other Ambulatory Visit: Payer: Self-pay

## 2024-03-03 VITALS — BP 118/72 | HR 68 | Temp 98.2°F | Resp 18 | Ht 63.0 in | Wt 138.8 lb

## 2024-03-03 DIAGNOSIS — J3089 Other allergic rhinitis: Secondary | ICD-10-CM | POA: Diagnosis not present

## 2024-03-03 DIAGNOSIS — L2089 Other atopic dermatitis: Secondary | ICD-10-CM

## 2024-03-03 DIAGNOSIS — K649 Unspecified hemorrhoids: Secondary | ICD-10-CM | POA: Insufficient documentation

## 2024-03-03 DIAGNOSIS — J302 Other seasonal allergic rhinitis: Secondary | ICD-10-CM | POA: Insufficient documentation

## 2024-03-03 DIAGNOSIS — J4520 Mild intermittent asthma, uncomplicated: Secondary | ICD-10-CM | POA: Diagnosis not present

## 2024-03-03 MED ORDER — RYALTRIS 665-25 MCG/ACT NA SUSP: NASAL | 0 refills | Status: DC

## 2024-03-03 MED ORDER — AIRSUPRA 90-80 MCG/ACT IN AERO: 2.0000 | INHALATION_SPRAY | RESPIRATORY_TRACT | 2 refills | Status: AC | PRN

## 2024-03-03 MED ORDER — RYALTRIS 665-25 MCG/ACT NA SUSP
1.0000 | Freq: Two times a day (BID) | NASAL | 5 refills | Status: AC
Start: 2024-03-03 — End: ?

## 2024-03-03 NOTE — Progress Notes (Addendum)
 Medication Samples have been provided to the patient.  Drug name: Ryaltris Nasal Spray       Strength: 623mcg/25mcg per nasal spray        Qty: 1  LOT: [id number]  Exp.Date: 02/28/2025  Dosing instructions: 1-2 sprays per nostril twice a day  The patient has been instructed regarding the correct time, dose, and frequency of taking this medication, including desired effects and most common side effects.   Troy Harper 2:57 PM 03/03/2024

## 2024-03-03 NOTE — Patient Instructions (Addendum)
 Rhinitis  Return for allergy skin testing. Will make additional recommendations based on results. If significant positives will recommend allergy injections - handout given. If negative will refer you to ENT next.  Make sure you don't take any antihistamines for 3 days before the skin testing appointment. Don't put any lotion on the back and arms on the day of testing.  Plan on being here for 30-60 minutes.   Start Ryaltris (olopatadine + mometasone nasal spray combination) 1-2 sprays per nostril twice a day. Sample given. Stop 3 days before.  This replaces your other nasal sprays. If this works well for you, then have pharmacy ship the medication to your home - prescription already sent in.  Nasal saline spray (i.e., Simply Saline) or nasal saline lavage (i.e., NeilMed) is recommended as needed and prior to medicated nasal sprays. Use over the counter antihistamines such as Zyrtec (cetirizine), Claritin (loratadine), Allegra (fexofenadine), or Xyzal (levocetirizine) daily as needed. May take twice a day during allergy flares. May switch antihistamines every few months. Stop 3 days before.   Asthma May use Airsupra rescue inhaler 2 puffs every 4 to 6 hours as needed for shortness of breath, chest tightness, coughing, and wheezing. Do not use more than 12 puffs in 24 hours. May use Airsupra rescue inhaler 2 puffs 5 to 15 minutes prior to strenuous physical activities. Rinse mouth after each use.  Coupon given.  Monitor frequency of use - if you need to use it more than twice per week on a consistent basis let us know.  If this is not covered let us know.  Breathing control goals:  Full participation in all desired activities (may need albuterol before activity) Albuterol use two times or less a week on average (not counting use with activity) Cough interfering with sleep two times or less a month Oral steroids no more than once a year No hospitalizations   Skin Keep track of rashes and  take pictures. See below for proper skin care. Use fragrance free and dye free products. No dryer sheets or fabric softener.   Use triamcinolone 0.1% cream twice a day as needed for rash flares. Do not use on the face, neck, armpits or groin area. Do not use more than 3 weeks in a row.   Follow up for skin testing  Skin care recommendations  Bath time: Always use lukewarm water. AVOID very hot or cold water. Keep bathing time to 5-10 minutes. Do NOT use bubble bath. Use a mild soap and use just enough to wash the dirty areas. Do NOT scrub skin vigorously.  After bathing, pat dry your skin with a towel. Do NOT rub or scrub the skin.  Moisturizers and prescriptions:  ALWAYS apply moisturizers immediately after bathing (within 3 minutes). This helps to lock-in moisture. Use the moisturizer several times a day over the whole body. Good summer moisturizers include: Aveeno, CeraVe, Cetaphil. Good winter moisturizers include: Aquaphor, Vaseline, Cerave, Cetaphil, Eucerin, Vanicream. When using moisturizers along with medications, the moisturizer should be applied about one hour after applying the medication to prevent diluting effect of the medication or moisturize around where you applied the medications. When not using medications, the moisturizer can be continued twice daily as maintenance.  Laundry and clothing: Avoid laundry products with added color or perfumes. Use unscented hypo-allergenic laundry products such as Tide free, Cheer free & gentle, and All free and clear.  If the skin still seems dry or sensitive, you can try double-rinsing the clothes. Avoid tight or  scratchy clothing such as wool. Do not use fabric softeners or dyer sheets.

## 2024-03-03 NOTE — Addendum Note (Signed)
 Addended by: Elsworth Soho on: 03/03/2024 03:00 PM   Modules accepted: Orders

## 2024-03-07 NOTE — Progress Notes (Unsigned)
 Skin testing note  RE: Troy Harper MRN: [id number] DOB: 05-30-2001 Date of Office Visit: 03/08/2024  Referring provider: Rebekah Chesterfield, NP Primary care provider: Rebekah Chesterfield, NP  Chief Complaint: skin testing  History of Present Illness: I had the pleasure of seeing Troy Harper for a skin testing visit at the [facility] on 03/08/2024. He is a 23 y.o. male, who is being followed for allergic rhinitis, asthma and atopic dermatitis. His previous allergy office visit was on 03/03/2024 with Dr. Selena Harper. Today is a skin testing visit.  He is accompanied today by his mother who provided/contributed to the history.   Discussed the use of AI scribe software for clinical note transcription with the patient, who gave verbal consent to proceed.    He has positive allergy test results for dust mites, which are a year-round allergen, and minor reactions to grass and weed pollen. He is concerned about potential allergies related to his new dog which was negative today.  He has not yet used his prescribed nasal spray due to the timing of his allergy test. His primary concern is sinus symptoms.   His skin condition is stable. His Airsupra inhaler was obtained at no cost due to a coupon, which was a significant reduction from the original price of eighty-five dollars.     Assessment and Plan: [name] is a 23 y.o. male with: Other allergic rhinitis Past history - chronic nasal congestion. OTC antihistamines and Flonase minimally effective. New dog may be contributing to his symptoms. Today's skin testing positive to dust mites, grass and borderline to weed mix.  Start environmental control measures as below. Use over the counter antihistamines such as Zyrtec (cetirizine), Claritin (loratadine), Allegra (fexofenadine), or Xyzal (levocetirizine) daily as needed. May take twice a day during allergy flares. May switch antihistamines every few months. Consider allergy injections  for long term control if above medications do not help the symptoms - handout given.  If interested in doing allergy shots - then get bloodwork for the dog dander.  Start Ryaltris (olopatadine + mometasone nasal spray combination) 1-2 sprays per nostril twice a day.  This replaces your other nasal sprays. If this works well for you, then have pharmacy ship the medication to your home - prescription already sent in.  Nasal saline spray (i.e., Simply Saline) or nasal saline lavage (i.e., NeilMed) is recommended as needed and prior to medicated nasal sprays.   Mild intermittent asthma without complication Past history - no prior asthma diagnosis but had issues with his breathing in the fall when he had bronchitis requiring albuterol, breztri and multiple steroid rounds. 2025 spirometry showed no overt abnormalities noted given today's efforts with 22% improvement in FEV1 post bronchodilator treatment. Clinically feeling unchanged - improvement possibly due to improved technique per patient. May use Airsupra rescue inhaler 2 puffs every 4 to 6 hours as needed for shortness of breath, chest tightness, coughing, and wheezing. Do not use more than 12 puffs in 24 hours. May use Airsupra rescue inhaler 2 puffs 5 to 15 minutes prior to strenuous physical activities. Rinse mouth after each use.  Monitor frequency of use - if you need to use it more than twice per week on a consistent basis let us know.    Other atopic dermatitis Past history - well controlled with minimal flares on the arms. Uses triamcinolone prn with good benefit.  Keep track of rashes and take pictures. See below for proper skin care. Use fragrance free  and dye free products. No dryer sheets or fabric softener.   Use triamcinolone 0.1% cream twice a day as needed for rash flares. Do not use on the face, neck, armpits or groin area. Do not use more than 3 weeks in a row.    Return in about 3 months (around 06/07/2024).  No orders of the  defined types were placed in this encounter.  Lab Orders         IgE Dog w/ Component Reflex      Diagnostics: Skin Testing: Environmental allergy panel. Today's skin testing positive to dust mites, grass and borderline to weed mix.  Results discussed with patient/family.  Airborne Adult Perc - 03/08/24 1351     Time Antigen Placed 1351    Allergen Manufacturer Waynette Buttery    Location Back    Number of Test 55    1. Control-Buffer 50% Glycerol Negative    2. Control-Histamine 3+    3. Bahia Negative    4. French Southern Territories Negative    5. Johnson Negative    6. Kentucky Blue Negative    7. Meadow Fescue Negative    8. Perennial Rye Negative    9. Timothy Negative    10. Ragweed Mix Negative    11. Cocklebur Negative    12. Plantain,  English Negative    13. Baccharis Negative    14. Dog Fennel Negative    15. Russian Thistle Negative    16. Lamb's Quarters Negative    17. Sheep Sorrell Negative    18. Rough Pigweed Negative    19. Marsh Elder, Rough Negative    20. Mugwort, Common Negative    21. Box, Elder Negative    22. Cedar, red Negative    23. Sweet Gum Negative    24. Pecan Pollen Negative    25. Pine Mix Negative    26. Walnut, Black Pollen Negative    27. Red Mulberry Negative    28. Ash Mix Negative    29. Birch Mix Negative    30. Beech American Negative    31. Cottonwood, Guinea-Bissau Negative    32. Hickory, White Negative    33. Maple Mix Negative    34. Oak, Guinea-[place]issau Mix Negative    35. Sycamore Eastern Negative    36. Alternaria Alternata Negative    37. Cladosporium Herbarum Negative    38. Aspergillus Mix Negative    39. Penicillium Mix Negative    40. Bipolaris Sorokiniana (Helminthosporium) Negative    41. Drechslera Spicifera (Curvularia) Negative    42. Mucor Plumbeus Negative    43. Fusarium Moniliforme Negative    44. Aureobasidium Pullulans (pullulara) Negative    45. Rhizopus Oryzae Negative    46. Botrytis Cinera Negative    47. Epicoccum Nigrum  Negative    48. Phoma Betae Negative    49. Dust Mite Mix 3+    50. Cat Hair 10,000 BAU/ml Negative    51.  Dog Epithelia Negative    52. Mixed Feathers Negative    53. Horse Epithelia Negative    54. Cockroach, German Negative    55. Tobacco Leaf Negative             Intradermal - 03/08/24 1419     Time Antigen Placed 1419    Allergen Manufacturer Waynette Buttery    Location Arm    Number of Test 14    Control Negative    Bahia Negative    French Southern Territories Negative    Johnson Negative  7 Grass 2+    Ragweed Mix Negative    Weed Mix --   +/-   Tree Mix Negative    Mold 1 Negative    Mold 2 Negative    Mold 3 Negative    Mold 4 Negative    Cat Negative    Dog Negative    Cockroach Negative             Previous notes and tests were reviewed. The plan was reviewed with the patient/family, and all questions/concerned were addressed.  It was my pleasure to see Devarion today and participate in his care. Please feel free to contact me with any questions or concerns.  Sincerely,  Wyline Mood, DO Allergy & Immunology  [facility] office: [phone] [facility] office: [phone]

## 2024-03-08 ENCOUNTER — Ambulatory Visit (INDEPENDENT_AMBULATORY_CARE_PROVIDER_SITE_OTHER): Admitting: Allergy

## 2024-03-08 ENCOUNTER — Encounter: Payer: Self-pay | Admitting: Allergy

## 2024-03-08 DIAGNOSIS — L2089 Other atopic dermatitis: Secondary | ICD-10-CM

## 2024-03-08 DIAGNOSIS — J3089 Other allergic rhinitis: Secondary | ICD-10-CM

## 2024-03-08 DIAGNOSIS — J4520 Mild intermittent asthma, uncomplicated: Secondary | ICD-10-CM

## 2024-03-08 NOTE — Patient Instructions (Addendum)
 Today's skin testing positive to dust mites, grass and borderline to weed mix.   Results given.  Environmental allergies Start environmental control measures as below. Use over the counter antihistamines such as Zyrtec (cetirizine), Claritin (loratadine), Allegra (fexofenadine), or Xyzal (levocetirizine) daily as needed. May take twice a day during allergy flares. May switch antihistamines every few months. Consider allergy injections for long term control if above medications do not help the symptoms - handout given.  If interested in doing allergy shots - then get bloodwork for the dog dander.  Start Ryaltris (olopatadine + mometasone nasal spray combination) 1-2 sprays per nostril twice a day.  This replaces your other nasal sprays. If this works well for you, then have pharmacy ship the medication to your home - prescription already sent in.  Nasal saline spray (i.e., Simply Saline) or nasal saline lavage (i.e., NeilMed) is recommended as needed and prior to medicated nasal sprays.  Asthma May use Airsupra rescue inhaler 2 puffs every 4 to 6 hours as needed for shortness of breath, chest tightness, coughing, and wheezing. Do not use more than 12 puffs in 24 hours. May use Airsupra rescue inhaler 2 puffs 5 to 15 minutes prior to strenuous physical activities. Rinse mouth after each use.  Monitor frequency of use - if you need to use it more than twice per week on a consistent basis let us know.  Breathing control goals:  Full participation in all desired activities (may need albuterol before activity) Albuterol use two times or less a week on average (not counting use with activity) Cough interfering with sleep two times or less a month Oral steroids no more than once a year No hospitalizations   Skin Keep track of rashes and take pictures. See below for proper skin care. Use fragrance free and dye free products. No dryer sheets or fabric softener.   Use triamcinolone 0.1% cream twice  a day as needed for rash flares. Do not use on the face, neck, armpits or groin area. Do not use more than 3 weeks in a row.   Follow up in 3 months or sooner if needed.   Skin care recommendations  Bath time: Always use lukewarm water. AVOID very hot or cold water. Keep bathing time to 5-10 minutes. Do NOT use bubble bath. Use a mild soap and use just enough to wash the dirty areas. Do NOT scrub skin vigorously.  After bathing, pat dry your skin with a towel. Do NOT rub or scrub the skin.  Moisturizers and prescriptions:  ALWAYS apply moisturizers immediately after bathing (within 3 minutes). This helps to lock-in moisture. Use the moisturizer several times a day over the whole body. Good summer moisturizers include: Aveeno, CeraVe, Cetaphil. Good winter moisturizers include: Aquaphor, Vaseline, Cerave, Cetaphil, Eucerin, Vanicream. When using moisturizers along with medications, the moisturizer should be applied about one hour after applying the medication to prevent diluting effect of the medication or moisturize around where you applied the medications. When not using medications, the moisturizer can be continued twice daily as maintenance.  Laundry and clothing: Avoid laundry products with added color or perfumes. Use unscented hypo-allergenic laundry products such as Tide free, Cheer free & gentle, and All free and clear.  If the skin still seems dry or sensitive, you can try double-rinsing the clothes. Avoid tight or scratchy clothing such as wool. Do not use fabric softeners or dyer sheets.   Control of House Dust Mite Allergen Dust mite allergens are a common trigger of allergy  and asthma symptoms. While they can be found throughout the house, these microscopic creatures thrive in warm, humid environments such as bedding, upholstered furniture and carpeting. Because so much time is spent in the bedroom, it is essential to reduce mite levels there.  Encase pillows, mattresses,  and box springs in special allergen-proof fabric covers or airtight, zippered plastic covers.  Bedding should be washed weekly in hot water (130 F) and dried in a hot dryer. Allergen-proof covers are available for comforters and pillows that can't be regularly washed.  Wash the allergy-proof covers every few months. Minimize clutter in the bedroom. Keep pets out of the bedroom.  Keep humidity less than 50% by using a dehumidifier or air conditioning. You can buy a humidity measuring device called a hygrometer to monitor this.  If possible, replace carpets with hardwood, linoleum, or washable area rugs. If that's not possible, vacuum frequently with a vacuum that has a HEPA filter. Remove all upholstered furniture and non-washable window drapes from the bedroom. Remove all non-washable stuffed toys from the bedroom.  Wash stuffed toys weekly.  Reducing Pollen Exposure Pollen seasons: trees (spring), grass (summer) and ragweed/weeds (fall). Keep windows closed in your home and car to lower pollen exposure.  Install air conditioning in the bedroom and throughout the house if possible.  Avoid going out in dry windy days - especially early morning. Pollen counts are highest between 5 - 10 AM and on dry, hot and windy days.  Save outside activities for late afternoon or after a heavy rain, when pollen levels are lower.  Avoid mowing of grass if you have grass pollen allergy. Be aware that pollen can also be transported indoors on people and pets.  Dry your clothes in an automatic dryer rather than hanging them outside where they might collect pollen.  Rinse hair and eyes before bedtime.

## 2024-03-10 ENCOUNTER — Ambulatory Visit: Admitting: Allergy

## 2024-06-06 NOTE — Progress Notes (Unsigned)
 Follow Up Note  RE: Troy Harper MRN: [id number] DOB: 08/12/2001 Date of Office Visit: 06/07/2024  Referring provider: Renato Dorothey HERO, NP Primary care provider: Renato Dorothey HERO, NP  Chief Complaint: No chief complaint on file.  History of Present Illness: I had the pleasure of seeing Troy Harper for a follow up visit at the [facility] on 06/07/2024. He is a 23 y.o. male, who is being followed for allergic rhinitis, asthma, atopic dermatitis. His previous allergy  office visit was on 03/08/2024 with Dr. Luke. Today is a regular follow up visit.  Discussed the use of AI scribe software for clinical note transcription with the patient, who gave verbal consent to proceed.  History of Present Illness            ***  Assessment and Plan: Troy Harper is a 23 y.o. male with: Other allergic rhinitis Past history - chronic nasal congestion. OTC antihistamines and Flonase minimally effective. New dog may be contributing to his symptoms. Today's skin testing positive to dust mites, grass and borderline to weed mix.  Start environmental control measures as below. Use over the counter antihistamines such as Zyrtec (cetirizine), Claritin (loratadine), Allegra (fexofenadine), or Xyzal (levocetirizine) daily as needed. May take twice a day during allergy  flares. May switch antihistamines every few months. Consider allergy  injections for long term control if above medications do not help the symptoms - handout given.  If interested in doing allergy  shots - then get bloodwork for the dog dander.  Start Ryaltris  (olopatadine + mometasone nasal spray combination) 1-2 sprays per nostril twice a day.  This replaces your other nasal sprays. If this works well for you, then have pharmacy ship the medication to your home - prescription already sent in.  Nasal saline spray (i.e., Simply Saline) or nasal saline lavage (i.e., NeilMed) is recommended as needed and prior to medicated  nasal sprays.   Mild intermittent asthma without complication Past history - no prior asthma diagnosis but had issues with his breathing in the fall when he had bronchitis requiring albuterol, breztri and multiple steroid rounds. 2025 spirometry showed no overt abnormalities noted given today's efforts with 22% improvement in FEV1 post bronchodilator treatment. Clinically feeling unchanged - improvement possibly due to improved technique per patient. May use Airsupra  rescue inhaler 2 puffs every 4 to 6 hours as needed for shortness of breath, chest tightness, coughing, and wheezing. Do not use more than 12 puffs in 24 hours. May use Airsupra  rescue inhaler 2 puffs 5 to 15 minutes prior to strenuous physical activities. Rinse mouth after each use.  Monitor frequency of use - if you need to use it more than twice per week on a consistent basis let us  know.    Other atopic dermatitis Past history - well controlled with minimal flares on the arms. Uses triamcinolone  prn with good benefit.  Keep track of rashes and take pictures. See below for proper skin care. Use fragrance free and dye free products. No dryer sheets or fabric softener.   Use triamcinolone  0.1% cream twice a day as needed for rash flares. Do not use on the face, neck, armpits or groin area. Do not use more than 3 weeks in a row.  Assessment and Plan              No follow-ups on file.  No orders of the defined types were placed in this encounter.  Lab Orders  No laboratory test(s) ordered today    Diagnostics: Spirometry:  Tracings reviewed. His effort: {Blank single:19197::Good reproducible efforts.,It was hard to get consistent efforts and there is a question as to whether this reflects a maximal maneuver.,Poor effort, data can not be interpreted.} FVC: ***L FEV1: ***L, ***% predicted FEV1/FVC ratio: ***% Interpretation: {Blank single:19197::Spirometry consistent with mild obstructive disease,Spirometry  consistent with moderate obstructive disease,Spirometry consistent with severe obstructive disease,Spirometry consistent with possible restrictive disease,Spirometry consistent with mixed obstructive and restrictive disease,Spirometry uninterpretable due to technique,Spirometry consistent with normal pattern,No overt abnormalities noted given today's efforts}.  Please see scanned spirometry results for details.  Skin Testing: {Blank single:19197::Select foods,Environmental allergy  panel,Environmental allergy  panel and select foods,Food allergy  panel,None,Deferred due to recent antihistamines use}. *** Results discussed with patient/family.   Medication List:  Current Outpatient Medications  Medication Sig Dispense Refill  . Albuterol-Budesonide (AIRSUPRA ) 90-80 MCG/ACT AERO Inhale 2 puffs into the lungs every 4 (four) hours as needed (coughing, wheezing, chest tightness). Do not exceed 12 puffs in 24 hours. 10.7 g 2  . cetirizine (ZYRTEC) 10 MG tablet Take 10 mg by mouth daily.    . clindamycin  (CLINDAGEL) 1 % gel Apply topically 2 (two) times daily. 30 g 1  . Olopatadine-Mometasone (RYALTRIS ) 665-25 MCG/ACT SUSP Place 1-2 sprays into the nose in the morning and at bedtime. 29 g 5  . Olopatadine-Mometasone (RYALTRIS ) 665-25 MCG/ACT SUSP 1-2 sprays per nostril twice a day 9 g 0  . SODIUM FLUORIDE 5000 PPM 1.1 % PSTE Take by mouth as directed.    . triamcinolone  cream (KENALOG ) 0.1 % Apply 1 application topically 2 (two) times daily. 135 g 3   No current facility-administered medications for this visit.   Allergies: Allergies  Allergen Reactions  . Menactra [Mening Acy&W-135 Diphth Conj] Rash   I reviewed his past medical history, social history, family history, and environmental history and no significant changes have been reported from his previous visit.  Review of Systems  Constitutional:  Negative for appetite change, chills, fever and unexpected weight  change.  HENT:  Positive for congestion. Negative for rhinorrhea.   Eyes:  Negative for itching.  Respiratory:  Negative for cough, chest tightness, shortness of breath and wheezing.   Cardiovascular:  Negative for chest pain.  Gastrointestinal:  Negative for abdominal pain.  Genitourinary:  Negative for difficulty urinating.  Skin:  Positive for rash.  Allergic/Immunologic: Positive for environmental allergies.  Neurological:  Negative for headaches.    Objective: There were no vitals taken for this visit. There is no height or weight on file to calculate BMI. Physical Exam Vitals and nursing note reviewed.  Constitutional:      Appearance: Normal appearance. He is well-developed.  HENT:     Head: Normocephalic and atraumatic.     Right Ear: Tympanic membrane and external ear normal.     Left Ear: Tympanic membrane and external ear normal.     Nose: Congestion present.     Mouth/Throat:     Mouth: Mucous membranes are moist.     Pharynx: Oropharynx is clear.  Eyes:     Conjunctiva/sclera: Conjunctivae normal.  Cardiovascular:     Rate and Rhythm: Normal rate and regular rhythm.     Heart sounds: Normal heart sounds. No murmur heard.    No friction rub. No gallop.  Pulmonary:     Effort: Pulmonary effort is normal.     Breath sounds: Normal breath sounds. No wheezing, rhonchi or rales.  Musculoskeletal:     Cervical back: Neck supple.  Skin:    General: Skin is  warm.     Findings: No rash.     Comments: Dry eczematous patch on left deltoid area.  Neurological:     Mental Status: He is alert and oriented to person, place, and time.  Psychiatric:        Behavior: Behavior normal.    Previous notes and tests were reviewed. The plan was reviewed with the patient/family, and all questions/concerned were addressed.  It was my pleasure to see Troy Harper today and participate in his care. Please feel free to contact me with any questions or concerns.  Sincerely,  Orlan Cramp,  DO Allergy  & Immunology  [facility]  [place] office: [phone] [facility] office: [phone]

## 2024-06-07 ENCOUNTER — Other Ambulatory Visit: Payer: Self-pay

## 2024-06-07 ENCOUNTER — Ambulatory Visit (INDEPENDENT_AMBULATORY_CARE_PROVIDER_SITE_OTHER): Admitting: Allergy

## 2024-06-07 ENCOUNTER — Encounter: Payer: Self-pay | Admitting: Allergy

## 2024-06-07 VITALS — BP 116/72 | HR 60 | Temp 98.8°F | Resp 18 | Ht 63.0 in | Wt 135.0 lb

## 2024-06-07 DIAGNOSIS — L2089 Other atopic dermatitis: Secondary | ICD-10-CM | POA: Diagnosis not present

## 2024-06-07 DIAGNOSIS — J3089 Other allergic rhinitis: Secondary | ICD-10-CM

## 2024-06-07 DIAGNOSIS — J302 Other seasonal allergic rhinitis: Secondary | ICD-10-CM | POA: Diagnosis not present

## 2024-06-07 DIAGNOSIS — J4520 Mild intermittent asthma, uncomplicated: Secondary | ICD-10-CM

## 2024-06-07 MED ORDER — MONTELUKAST SODIUM 10 MG PO TABS: 10.0000 mg | ORAL_TABLET | Freq: Every day | ORAL | 3 refills | Status: AC

## 2024-06-07 NOTE — Patient Instructions (Addendum)
 Environmental allergies 2025 skin testing positive to dust mites, grass and borderline to weed mix.  Continue environmental control measures as below. Use over the counter antihistamines such as Zyrtec (cetirizine), Claritin (loratadine), Allegra (fexofenadine), or Xyzal (levocetirizine) daily as needed. May take twice a day during allergy  flares. May switch antihistamines every few months. Start Singulair  (montelukast ) 10mg  daily at night. Cautioned that in some children/adults can experience behavioral changes including hyperactivity, agitation, depression, sleep disturbances and suicidal ideations. These side effects are rare, but if you notice them you should notify me and discontinue Singulair  (montelukast ).  Consider allergy  injections for long term control if above medications do not help the symptoms -  If interested in doing allergy  shots - then get bloodwork for the dog dander.  Continue Ryaltris  (olopatadine + mometasone nasal spray combination) 1-2 sprays per nostril twice a day.  Nasal saline spray (i.e., Simply Saline) or nasal saline lavage (i.e., NeilMed) is recommended as needed and prior to medicated nasal sprays.  Asthma May use Airsupra  rescue inhaler 2 puffs every 4 to 6 hours as needed for shortness of breath, chest tightness, coughing, and wheezing. Do not use more than 12 puffs in 24 hours. May use Airsupra  rescue inhaler 2 puffs 5 to 15 minutes prior to strenuous physical activities. Rinse mouth after each use.  Monitor frequency of use - if you need to use it more than twice per week on a consistent basis let us  know.  Breathing control goals:  Full participation in all desired activities (may need albuterol before activity) Albuterol use two times or less a week on average (not counting use with activity) Cough interfering with sleep two times or less a month Oral steroids no more than once a year No hospitalizations   Skin Keep track of rashes and take  pictures. Continue proper skin care. Use triamcinolone  0.1% cream twice a day as needed for rash flares. Do not use on the face, neck, armpits or groin area. Do not use more than 3 weeks in a row.   Follow up in 3-4 months or sooner if needed.   Control of House Dust Mite Allergen Dust mite allergens are a common trigger of allergy  and asthma symptoms. While they can be found throughout the house, these microscopic creatures thrive in warm, humid environments such as bedding, upholstered furniture and carpeting. Because so much time is spent in the bedroom, it is essential to reduce mite levels there.  Encase pillows, mattresses, and box springs in special allergen-proof fabric covers or airtight, zippered plastic covers.  Bedding should be washed weekly in hot water (130 F) and dried in a hot dryer. Allergen-proof covers are available for comforters and pillows that can't be regularly washed.  Wash the allergy -proof covers every few months. Minimize clutter in the bedroom. Keep pets out of the bedroom.  Keep humidity less than 50% by using a dehumidifier or air conditioning. You can buy a humidity measuring device called a hygrometer to monitor this.  If possible, replace carpets with hardwood, linoleum, or washable area rugs. If that's not possible, vacuum frequently with a vacuum that has a HEPA filter. Remove all upholstered furniture and non-washable window drapes from the bedroom. Remove all non-washable stuffed toys from the bedroom.  Wash stuffed toys weekly.  Reducing Pollen Exposure Pollen seasons: trees (spring), grass (summer) and ragweed/weeds (fall). Keep windows closed in your home and car to lower pollen exposure.  Install air conditioning in the bedroom and throughout the house if possible.  Avoid going out in dry windy days - especially early morning. Pollen counts are highest between 5 - 10 AM and on dry, hot and windy days.  Save outside activities for late afternoon or  after a heavy rain, when pollen levels are lower.  Avoid mowing of grass if you have grass pollen allergy . Be aware that pollen can also be transported indoors on people and pets.  Dry your clothes in an automatic dryer rather than hanging them outside where they might collect pollen.  Rinse hair and eyes before bedtime.

## 2024-09-06 ENCOUNTER — Ambulatory Visit: Admitting: Allergy

## 2024-10-18 ENCOUNTER — Ambulatory Visit: Admitting: Dermatology

## 2024-10-18 ENCOUNTER — Encounter: Payer: Self-pay | Admitting: Dermatology

## 2024-10-18 VITALS — BP 127/79

## 2024-10-18 DIAGNOSIS — L739 Follicular disorder, unspecified: Secondary | ICD-10-CM | POA: Diagnosis not present

## 2024-10-18 DIAGNOSIS — K130 Diseases of lips: Secondary | ICD-10-CM

## 2024-10-18 MED ORDER — ADAPALENE 0.3 % EX GEL: CUTANEOUS | 2 refills | Status: AC

## 2024-10-18 NOTE — Patient Instructions (Addendum)
 VISIT SUMMARY:  Today, you were seen for breakouts on your face and neck. You have a history of acne, previously treated with Accutane , and have been using clindamycin  gel for maintenance. Currently, you are experiencing persistent bumps on your neck and have been using hydrocortisone sparingly. Additionally, you mentioned frequent use of chapstick due to dry lips.  YOUR PLAN:  -FOLLICULITIS OF THE NECK:  Folliculitis is an inflammation of the hair follicles, causing persistent bumps.   You have been advised to continue using clindamycin  gel on both your face and neck, morning and night.   Samples of Tolerance moisturizer were provided for use on your face and neck.   You were also prescribed adapalene 0.3% to use two nights a week, with instructions to adjust if your skin becomes too dry.   Additionally, you should wash your neck with Cetaphil and use Aveeno shaving gel for sensitive skin. Use hydrocortisone only if itchy and avoid overuse.    -CHAPPED LIPS DUE TO OVERUSE OF LIP BALM:  Frequent use of lip balm can lead to reduced natural oil production, causing dry lips. You have been advised to use Aquaphor Body Balm with shea butter and avocado oil for longer-lasting hydration. Try to reduce the frequency of lip balm application, especially during the summer months.  INSTRUCTIONS:  Monitor your skin for improvement every three weeks. If there is no improvement in six weeks, please schedule a follow-up appointment to consider oral antibiotics.      Important Information  Due to recent changes in healthcare laws, you may see results of your pathology and/or laboratory studies on MyChart before the doctors have had a chance to review them. We understand that in some cases there may be results that are confusing or concerning to you. Please understand that not all results are received at the same time and often the doctors may need to interpret multiple results in order to provide you  with the best plan of care or course of treatment. Therefore, we ask that you please give us  2 business days to thoroughly review all your results before contacting the office for clarification. Should we see a critical lab result, you will be contacted sooner.   If You Need Anything After Your Visit  If you have any questions or concerns for your doctor, please call our main line at [phone] If no one answers, please leave a voicemail as directed and we will return your call as soon as possible. Messages left after 4 pm will be answered the following business day.   You may also send us  a message via MyChart. We typically respond to MyChart messages within 1-2 business days.  For prescription refills, please ask your pharmacy to contact our office. Our fax number is [phone].  If you have an urgent issue when the clinic is closed that cannot wait until the next business day, you can page your doctor at the number below.    Please note that while we do our best to be available for urgent issues outside of office hours, we are not available 24/7.   If you have an urgent issue and are unable to reach us , you may choose to seek medical care at your doctor's office, retail clinic, urgent care center, or emergency room.  If you have a medical emergency, please immediately call 911 or go to the emergency department. In the event of inclement weather, please call our main line at [phone] for an update on the status of any  delays or closures.  Dermatology Medication Tips: Please keep the boxes that topical medications come in in order to help keep track of the instructions about where and how to use these. Pharmacies typically print the medication instructions only on the boxes and not directly on the medication tubes.   If your medication is too expensive, please contact our office at [phone] or send us  a message through MyChart.   We are unable to tell what your co-pay for  medications will be in advance as this is different depending on your insurance coverage. However, we may be able to find a substitute medication at lower cost or fill out paperwork to get insurance to cover a needed medication.   If a prior authorization is required to get your medication covered by your insurance company, please allow us  1-2 business days to complete this process.  Drug prices often vary depending on where the prescription is filled and some pharmacies may offer cheaper prices.  The website www.goodrx.com contains coupons for medications through different pharmacies. The prices here do not account for what the cost may be with help from insurance (it may be cheaper with your insurance), but the website can give you the price if you did not use any insurance.  - You can print the associated coupon and take it with your prescription to the pharmacy.  - You may also stop by our office during regular business hours and pick up a GoodRx coupon card.  - If you need your prescription sent electronically to a different pharmacy, notify our office through [facility] or by phone at [phone]

## 2024-10-18 NOTE — Progress Notes (Signed)
   New Patient Visit   Subjective  Troy Harper is a 23 y.o. male who presents for a NEW PATIENT appointment to be examined for the concerns as listed below.   Acne: Pt stated that he was previously on Accutane  5-6years ago which cleared up his acne. However, he noticed a year ago after he shaves he has a breakout under the chin/neck. He is currently using hydrocortisone cream after shaving and he uses after shave in the mornings for prevention.    The following portions of the chart were reviewed this encounter and updated as appropriate: medications, allergies, medical history  Review of Systems:  No other skin or systemic complaints except as noted in HPI or Assessment and Plan.  Objective  Well appearing patient in no apparent distress; mood and affect are within normal limits.   A focused examination was performed of the following areas: chin/neck   Relevant exam findings are noted in the Assessment and Plan.     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 Current regimen includes clindamycin  gel for facial acne, but not consistently used on the neck. Hydrocortisone use is uncertain in efficacy and may contribute to folliculitis if overused.  - Continue clindamycin  gel on the neck and face, both morning and night. - Provided samples of Tolerance moisturizer for use on face and neck. - Prescribed adapalene 0.3% for use two nights a week, with instructions to adjust frequency if skin becomes too dry. - Advised washing neck with Cetaphil and using Aveeno shaving gel for sensitive skin. - Instructed to use hydrocortisone only if itchy, and to avoid overuse.    Chapped lips due to overuse of lip balm Chapped lips due to frequent use of lip balm, possibly leading to reduced natural oil production. Current lip balm is used every 20 minutes, causing dryness when not  used. - Recommended Aquaphor Body Balm with shea butter and avocado oil for longer-lasting hydration. - Advised reducing frequency of lip balm application, especially during summer months.   No follow-ups on file.   Documentation: I have reviewed the above documentation for accuracy and completeness, and I agree with the above.  I, Shirron Maranda, CMA, am acting as scribe for [employer], DO.   Delon Lenis, DO

## 2025-04-18 ENCOUNTER — Ambulatory Visit: Admitting: Dermatology
